# Patient Record
Sex: Female | Born: 1970 | Race: White | Hispanic: No | Marital: Single
Health system: Midwestern US, Academic
[De-identification: ages and names within clinical notes are randomized; demographics above are authoritative.]

---

## 2016-08-11 MED ORDER — TIZANIDINE 4 MG PO TAB
ORAL_TABLET | Freq: Every evening | 0 refills
Start: 2016-08-11 — End: ?

## 2016-08-25 MED ORDER — INDOMETHACIN 25 MG PO CAP
ORAL_CAPSULE | Freq: Every evening | 0 refills | PRN
Start: 2016-08-25 — End: ?

## 2016-08-28 MED ORDER — SUMATRIPTAN SUCCINATE 100 MG PO TAB
ORAL_TABLET | 0 refills
Start: 2016-08-28 — End: ?

## 2016-09-11 ENCOUNTER — Encounter: Admit: 2016-09-11 | Discharge: 2016-09-11

## 2016-09-11 DIAGNOSIS — G43709 Chronic migraine without aura, not intractable, without status migrainosus: Principal | ICD-10-CM

## 2016-09-11 NOTE — Telephone Encounter
Patient provided Botox migraine protocol education by clinic RN. Topics covered: benefits & risks, including most common side effects & realistic expectations; no indication in pregnancy & will not be administered if pregnant or trying to become pregnant; clinic prior authorization (PA) process; Botox Savings Program (if applicable). Patient provided with take home Botox folder by provider when in clinic recently, including the following patient handouts: Detailed Migraine diary; Botox Pre Treatment Information; Post Procedure Patient Instructions; [employer]/product designer(manufacturer) brochures: Geralynn Ochsont Lie Down (including information about Botox Savings Program), Stand Up, What To Expect With Botox (including Summary of Information about Botox insert); Direct contact information for clinic Botox Coordinator RN. All questions answered and clarification provided as needed. Patient verbalized understanding of all content discussed. Pt scheduled for BTX inj #1 on 09/18/16 @ 0930 with arrival to check in @ 0915.

## 2016-09-25 ENCOUNTER — Encounter: Admit: 2016-09-25 | Discharge: 2016-09-25

## 2016-09-25 ENCOUNTER — Ambulatory Visit: Admit: 2016-09-25 | Discharge: 2016-09-26 | Payer: MEDICAID

## 2016-09-25 DIAGNOSIS — F419 Anxiety disorder, unspecified: ICD-10-CM

## 2016-09-25 DIAGNOSIS — K5790 Diverticulosis of intestine, part unspecified, without perforation or abscess without bleeding: ICD-10-CM

## 2016-09-25 DIAGNOSIS — M26609 Unspecified temporomandibular joint disorder, unspecified side: ICD-10-CM

## 2016-09-25 DIAGNOSIS — R51 Headache: Principal | ICD-10-CM

## 2016-09-25 DIAGNOSIS — R002 Palpitations: ICD-10-CM

## 2016-09-25 DIAGNOSIS — E063 Autoimmune thyroiditis: ICD-10-CM

## 2016-09-25 MED ORDER — BOTULINUM TOXIN TYPE A 100 UNIT/ML INJECTION (OR)
200 [IU] | Freq: Once | 0 refills | Status: CP
Start: 2016-09-25 — End: ?
  Administered 2016-09-26: 22:00:00 200 [IU]

## 2016-09-25 NOTE — Progress Notes
Subjective: Andrea Nielsen is a 46 y.o. female here today for Botox for chronic migraines.     Do you understand the risks of botox as discussed in the consent and wish to proceed?   Patient states Yes    Are you currently pregnant or trying to become pregnant, as botox is not indicated while pregnant? Patient states No    Vitals:    09/25/16 1509   BP: (!) 148/91   Pulse: 62       Botox Injection Procedure  Previous Injection Date: NA    Information given verbally to the patient  today  included, but was not limited to:    Most common side effects: neck pain, headache, eyelid ptosis, migraine, muscular weakness, musculoskeletal stiffness, bronchitis, injection-site pain, musculoskeletal pain, myalgia, facial paresis, hypertension, muscle spasms; infection at injections sites, bruising, bleeding    Most serious side effects/risks: anaphylaxis, dysphagia,  arrhythmia, myocardial infarction, and in some cases, spontaneous death    Consent form was signed.    Confirmed: patient, procedure, side, site, safety procedures followed.     Performed by:  Sanda Klein, MD  Preparation: no contraindications noted to Botox, possible medications prior to procedure:  topical numbing cream prior to procedure, Preparation of site with alcohol    Procedure performed:   Indication: Chronic Migraine Headaches  Medication: Onabotulinum toxin A  2.63ml normal saline as diluent /100 units (Botulinum Toxin 5 units per 0.26mL, 200 units prepared, 155 units used, 45 units of waste)    Location: PREEMPT protocol Jerrell Belfast SK, and coauthors. Cephalalgia, 2010;30:793)  Muscles/Sites Injected  A - Bilateral Corrugator - 10 units divided in 2 sites  B - Midline Procerus - 5 units in 1 site  C - Bilateral Frontalis - 20 units divided in 4 sites  D - Bilateral Temporalis - 40 units divided in 8 sites  E - Bilateral Occipitalis - 30 units divided in 6 sites  F - Bilateral Cervical Paraspinals - 20 units divided in 4 sites G - Bilateral Trapezius - 30 units divided in 6 sites    Total Dose - 155 Units divided in 31 sites    Lot/Expiration: [id number]/ Dec 2020    Procedure tolerated: well.    Complications: none.  Diagnosis - Chronic Migraine Headaches   Course:  Progressing as expected.    Counseled: Patient/Family, Regarding diagnosis, Regarding treatment, Regarding medications. If any serious side effects occur, the patient has been instructed to go to the nearest emergency room and call our office.    Follow up: as scheduled prior to next Botox administration.

## 2016-09-26 DIAGNOSIS — G43709 Chronic migraine without aura, not intractable, without status migrainosus: Principal | ICD-10-CM

## 2016-10-01 ENCOUNTER — Encounter: Admit: 2016-10-01 | Discharge: 2016-10-01

## 2016-10-01 NOTE — Telephone Encounter
Patient lvm stating that she had botox on 6/21 and she is now experiencing muscle weakness in her forehead and eyebrows. Advised patient that the weakness and drooping will resolve on its own, patient voiced understanding.

## 2016-10-02 ENCOUNTER — Encounter: Admit: 2016-10-02 | Discharge: 2016-10-02

## 2016-10-02 MED ORDER — PROCHLORPERAZINE MALEATE 10 MG PO TAB
ORAL_TABLET | 0 refills
Start: 2016-10-02 — End: ?

## 2016-10-02 MED ORDER — PROCHLORPERAZINE MALEATE 10 MG PO TAB
ORAL_TABLET | 3 refills | Status: AC
Start: 2016-10-02 — End: 2019-02-16

## 2016-10-02 NOTE — Telephone Encounter
Patient is requesting refills on compazine previously prescribed by Dr. Patrcia DollyBittel. Are you continuing this?

## 2016-10-06 ENCOUNTER — Encounter: Admit: 2016-10-06 | Discharge: 2016-10-06

## 2016-10-06 MED ORDER — SUMATRIPTAN SUCCINATE 100 MG PO TAB
ORAL_TABLET | SUBCUTANEOUS | 1 refills | 30.00000 days | Status: AC
Start: 2016-10-06 — End: 2017-01-01

## 2016-10-06 MED ORDER — TIZANIDINE 4 MG PO TAB
8 mg | ORAL_TABLET | Freq: Every evening | ORAL | 1 refills | Status: AC | PRN
Start: 2016-10-06 — End: 2016-12-05

## 2016-10-06 MED ORDER — INDOMETHACIN 25 MG PO CAP
25 mg | ORAL_CAPSULE | Freq: Every evening | ORAL | 1 refills | 17.50000 days | Status: AC | PRN
Start: 2016-10-06 — End: 2019-08-16

## 2016-10-06 NOTE — Telephone Encounter
Patient is requesting refills on tizanidine 8mg  qHS, indomethacin 25mg  @ HS PRN, and imitrex 100mg .     Dr. Chales AbrahamsGupta are you continuing these medications?

## 2016-11-20 ENCOUNTER — Encounter: Admit: 2016-11-20 | Discharge: 2016-11-20

## 2016-11-20 MED ORDER — TOPIRAMATE 50 MG PO TAB
ORAL_TABLET | Freq: Every day | 0 refills | Status: AC
Start: 2016-11-20 — End: 2017-01-27

## 2016-12-05 ENCOUNTER — Encounter: Admit: 2016-12-05 | Discharge: 2016-12-05

## 2016-12-05 MED ORDER — TIZANIDINE 4 MG PO TAB
8 mg | ORAL_TABLET | Freq: Every evening | ORAL | 0 refills | Status: AC | PRN
Start: 2016-12-05 — End: 2017-01-01

## 2016-12-19 ENCOUNTER — Ambulatory Visit: Admit: 2016-12-19 | Discharge: 2016-12-20 | Payer: MEDICAID

## 2016-12-19 ENCOUNTER — Encounter: Admit: 2016-12-19 | Discharge: 2016-12-19

## 2016-12-19 DIAGNOSIS — M26609 Unspecified temporomandibular joint disorder, unspecified side: ICD-10-CM

## 2016-12-19 DIAGNOSIS — F419 Anxiety disorder, unspecified: ICD-10-CM

## 2016-12-19 DIAGNOSIS — R002 Palpitations: ICD-10-CM

## 2016-12-19 DIAGNOSIS — K5790 Diverticulosis of intestine, part unspecified, without perforation or abscess without bleeding: ICD-10-CM

## 2016-12-19 DIAGNOSIS — R51 Headache: Principal | ICD-10-CM

## 2016-12-19 DIAGNOSIS — E063 Autoimmune thyroiditis: ICD-10-CM

## 2016-12-19 MED ORDER — BOTULINUM TOXIN TYPE A 100 UNIT/ML INJECTION (OR)
200 [IU] | Freq: Once | 0 refills | Status: CP
Start: 2016-12-19 — End: ?
  Administered 2016-12-19: 21:00:00 200 [IU]

## 2016-12-19 NOTE — Progress Notes
Subjective: Andrea Nielsen is a 46 y.o. female here today for Botox for chronic migraines. She does not want injections around eyebrow muscles and middle of the head.     Do you understand the risks of botox as discussed in the consent and wish to proceed?   Patient states Yes    Are you currently pregnant or trying to become pregnant, as botox is not indicated while pregnant? Patient states No    Vitals:    12/19/16 1551   BP: (!) 159/95   Pulse: 87   SpO2: 98%       Botox Injection Procedure  Previous Injection Date: 25 September 2016    Information given verbally to the patient  today  included, but was not limited to:    Most common side effects: neck pain, headache, eyelid ptosis, migraine, muscular weakness, musculoskeletal stiffness, bronchitis, injection-site pain, musculoskeletal pain, myalgia, facial paresis, hypertension, muscle spasms; infection at injections sites, bruising, bleeding    Most serious side effects/risks: anaphylaxis, dysphagia,  arrhythmia, myocardial infarction, and in some cases, spontaneous death    Consent form was signed.    Confirmed: patient, procedure, side, site, safety procedures followed.     Performed by: Sanda Klein, MD  Preparation: no contraindications noted to Botox, possible medications prior to procedure:  topical numbing cream prior to procedure, Preparation of site with alcohol    Procedure performed:   Indication: Chronic Migraine Headaches  Medication: Onabotulinum toxin A  2.86ml normal saline as diluent /100 units (Botulinum Toxin 5 units per 0.58mL, 200 units prepared, 155 units used, 45 units of waste)    Location: PREEMPT protocol Jerrell Belfast SK, and coauthors. Cephalalgia, 2010;30:793)  Muscles/Sites Injected  C - Bilateral Frontalis - 20 units divided in 4 sites  D - Bilateral Temporalis - 40 units divided in 8 sites  E - Bilateral Occipitalis - 30 units divided in 6 sites  F - Bilateral Cervical Paraspinals - 20 units divided in 4 sites G - Bilateral Trapezius - 30 units divided in 6 sites    Total Dose - 140 Units divided in 28 sites    Lot/Expiration: [id number], 06/2019    Procedure tolerated: well.    Complications: none.  Diagnosis - Chronic Migraine Headaches   Course:  Progressing as expected.    Counseled: Patient/Family, Regarding diagnosis, Regarding treatment, Regarding medications. If any serious side effects occur, the patient has been instructed to go to the nearest emergency room and call our office.    Follow up: as scheduled prior to next Botox administration.

## 2016-12-20 DIAGNOSIS — G43709 Chronic migraine without aura, not intractable, without status migrainosus: Principal | ICD-10-CM

## 2017-01-01 ENCOUNTER — Encounter: Admit: 2017-01-01 | Discharge: 2017-01-01

## 2017-01-01 MED ORDER — SUMATRIPTAN SUCCINATE 100 MG PO TAB
ORAL_TABLET | SUBCUTANEOUS | 2 refills | 30.00000 days | Status: AC
Start: 2017-01-01 — End: 2019-02-16

## 2017-01-01 MED ORDER — TIZANIDINE 4 MG PO TAB
8 mg | ORAL_TABLET | Freq: Every evening | ORAL | 2 refills | Status: AC | PRN
Start: 2017-01-01 — End: 2019-02-16

## 2017-01-27 ENCOUNTER — Encounter: Admit: 2017-01-27 | Discharge: 2017-01-27

## 2017-01-27 MED ORDER — TOPIRAMATE 50 MG PO TAB
100 mg | ORAL_TABLET | Freq: Every evening | ORAL | 0 refills | Status: AC
Start: 2017-01-27 — End: 2019-02-16

## 2017-03-12 ENCOUNTER — Encounter: Admit: 2017-03-12 | Discharge: 2017-03-12

## 2017-03-12 NOTE — Telephone Encounter
Email received from Tish in [employer] "Received called from [place] 03/11/17 at [employer]  [phone] needing additional information.       Called patient for update. She reports that prior to initiating Botox therapy injections, she was experiencing 30/30 migraine days per month, lasting all day. Currently she describes "tension headaches, primarily on my right side and the back of my neck. In my temple usually and shoots across. It's sharp. I have them every day again. It starts probably after lunch and for the remainder of the day. Every day."     Update faxed to [employer] as requested. Notified Tish in [employer] Dept of same.

## 2017-03-20 NOTE — Telephone Encounter
Received fax from [employer] stating "We have reviewed your request for BOTOX 200UNIT SOLUTION RECONSTITUTED for the member identified above and have approved the request as follows"     Approved for March 19, 2017 through Sep 02, 2017  Approved J Code: [id number]

## 2017-06-01 ENCOUNTER — Encounter: Admit: 2017-06-01 | Discharge: 2017-06-01

## 2019-01-27 ENCOUNTER — Encounter: Admit: 2019-01-27 | Discharge: 2019-01-27

## 2019-01-27 DIAGNOSIS — R69 Illness, unspecified: Secondary | ICD-10-CM

## 2019-02-16 ENCOUNTER — Ambulatory Visit: Admit: 2019-02-16 | Discharge: 2019-02-16 | Payer: BC Managed Care – PPO

## 2019-02-16 ENCOUNTER — Encounter: Admit: 2019-02-16 | Discharge: 2019-02-16 | Payer: BC Managed Care – PPO

## 2019-02-16 DIAGNOSIS — F419 Anxiety disorder, unspecified: Secondary | ICD-10-CM

## 2019-02-16 DIAGNOSIS — M26609 Unspecified temporomandibular joint disorder, unspecified side: Secondary | ICD-10-CM

## 2019-02-16 DIAGNOSIS — R519 Generalized headaches: Secondary | ICD-10-CM

## 2019-02-16 DIAGNOSIS — K5790 Diverticulosis of intestine, part unspecified, without perforation or abscess without bleeding: Secondary | ICD-10-CM

## 2019-02-16 DIAGNOSIS — G43809 Other migraine, not intractable, without status migrainosus: Secondary | ICD-10-CM

## 2019-02-16 DIAGNOSIS — R002 Palpitations: Secondary | ICD-10-CM

## 2019-02-16 DIAGNOSIS — E063 Autoimmune thyroiditis: Secondary | ICD-10-CM

## 2019-02-16 MED ORDER — SUMATRIPTAN SUCCINATE 100 MG PO TAB
ORAL_TABLET | SUBCUTANEOUS | 2 refills | 30.00000 days | Status: DC
Start: 2019-02-16 — End: 2019-05-30

## 2019-02-16 MED ORDER — BACLOFEN 5 MG PO TAB
5 mg | ORAL_TABLET | Freq: Two times a day (BID) | ORAL | 3 refills | Status: DC
Start: 2019-02-16 — End: 2019-06-13

## 2019-02-16 NOTE — Progress Notes
Date of Service: 02/16/2019    Subjective:             Andrea Nielsen is a 48 y.o. female seen in the clinic for migraine headache.     History of Present Illness  48 year old female who had a seizure last month and was admitted to the hospital for 4 days. CT head was normal. She lives with her boyfriend in [place], [place] and her daughter lives with her as well. 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 Her pain is located in the back of her neck and temples. It is about 6/10 in intensity and has a headache every day. There is nausea and vomiting with the pain along with photophobia and phonophobia. The last session of Botox in 2018 and she has not been taking the medicines since 2018. Her last visit to the ER was 2 weeks ago. She has not missed work due to headaches and taking Tylenol/Ibuprofen for headaches.     In the past she has been tried on indomethacin, acetaminophen, ibuprofen, compazine, topiramate, tizanidine, venlafaxine, and Imitrex.        Review of Systems   Constitutional: Positive for activity change, diaphoresis, fatigue and unexpected weight change.   HENT: Positive for ear pain.    Eyes: Positive for photophobia and visual disturbance.   Gastrointestinal: Positive for abdominal distention, blood in stool and diarrhea.   Musculoskeletal: Positive for arthralgias, back pain, myalgias, neck pain and neck stiffness.   Neurological: Positive for seizures and headaches.   Psychiatric/Behavioral: Positive for decreased concentration, dysphoric mood and sleep disturbance. The patient is nervous/anxious.    All other systems reviewed and are negative.    Objective:         ? acetaminophen (TYLENOL) 325 mg tablet Take 325 mg by mouth every 4 hours as needed for Pain.   ? ALPRAZolam (XANAX) 0.5 mg tablet Take 0.5 mg by mouth at bedtime as needed.   ? amLODIPine (NORVASC) 5 mg tablet Take 5 mg by mouth daily.   ? diphenhydrAMINE (BENADRYL ALLERGY) 25 mg tablet Take 25 mg by mouth at bedtime as needed.   ? HYDROmorphone (DILAUDID) 2 mg tablet Take 2 mg by mouth every 4 hours as needed for Pain   ? ibuprofen (ADVIL) 200 mg tablet Take 400-600 mg by mouth every 6 hours as needed for Pain. Take with food.   ? indomethacin (INDOCIN) 25 mg capsule Take 1 capsule by mouth at bedtime as needed. Take with food.   ? levETIRAcetam (KEPPRA) 500 mg tablet Take 500 mg by mouth twice daily.   ? levothyroxine (SYNTHROID) 75 mcg tablet Take 75 mcg by mouth daily.   ? lisinopril (PRINIVIL; ZESTRIL) 10 mg tablet Take 10 mg by mouth daily.   ? losartan (COZAAR) 100 mg tablet Take 100 mg by mouth daily.   ? pantoprazole DR (PROTONIX) 40 mg tablet Take 40 mg by mouth daily.   ? prochlorperazine maleate (COMPAZINE) 10 mg tablet TAKE 1/2 TO 1 TABLET BY MOUTH WITH BENADRYL 25MG  AND ALEVE EVERY 6 HOURS AS NEEDED FOR ACUTE HEADACHE   ? sumatriptan succinate (IMITREX) 100 mg tablet Take 1/2 tab by mouth at onset of headache. May repeat in 2 hours as needed. Max 2 per day, 8 per month   ? tiZANidine (ZANAFLEX) 4 mg tablet Take two tablets by mouth at bedtime as needed.   ? topiramate (TOPAMAX) 50 mg tablet Take two tablets by mouth at bedtime daily.   ?  valACYclovir (VALTREX) 500 mg tablet as Needed.   ? venlafaxine XR (EFFEXOR XR) 75 mg capsule Take 75 mg by mouth daily.     Vitals:    02/16/19 1600   BP: 138/83   BP Source: Arm, Left Upper   Patient Position: Sitting   Pulse: 97   Temp: 36.7 ?C (98.1 ?F)   TempSrc: Oral   SpO2: 97%   Weight: 78.6 kg (173 lb 3.2 oz)   PainSc: Six     Body mass index is 30.68 kg/m?Marland Kitchen     Physical Exam  Did not examine her today        Assessment and Plan:  48 year old female with migraine headaches.     1. Start baclofen 5 mg twice a day.     2. Sumatriptan to break the headache.       I spent 15 minutes with this patient. More than 50% of the time was spent in counseling and coordination of care.

## 2019-02-27 IMAGING — MG MAMMO SCREEN BILAT W OR WO CAD
4 series · 4 of 4 positions shown · non-contrast
Comparison: 09/05/2014.

Bilateral screening mammogram. CAD analysis utilized .
HISTORY: Screening.

[R CC]
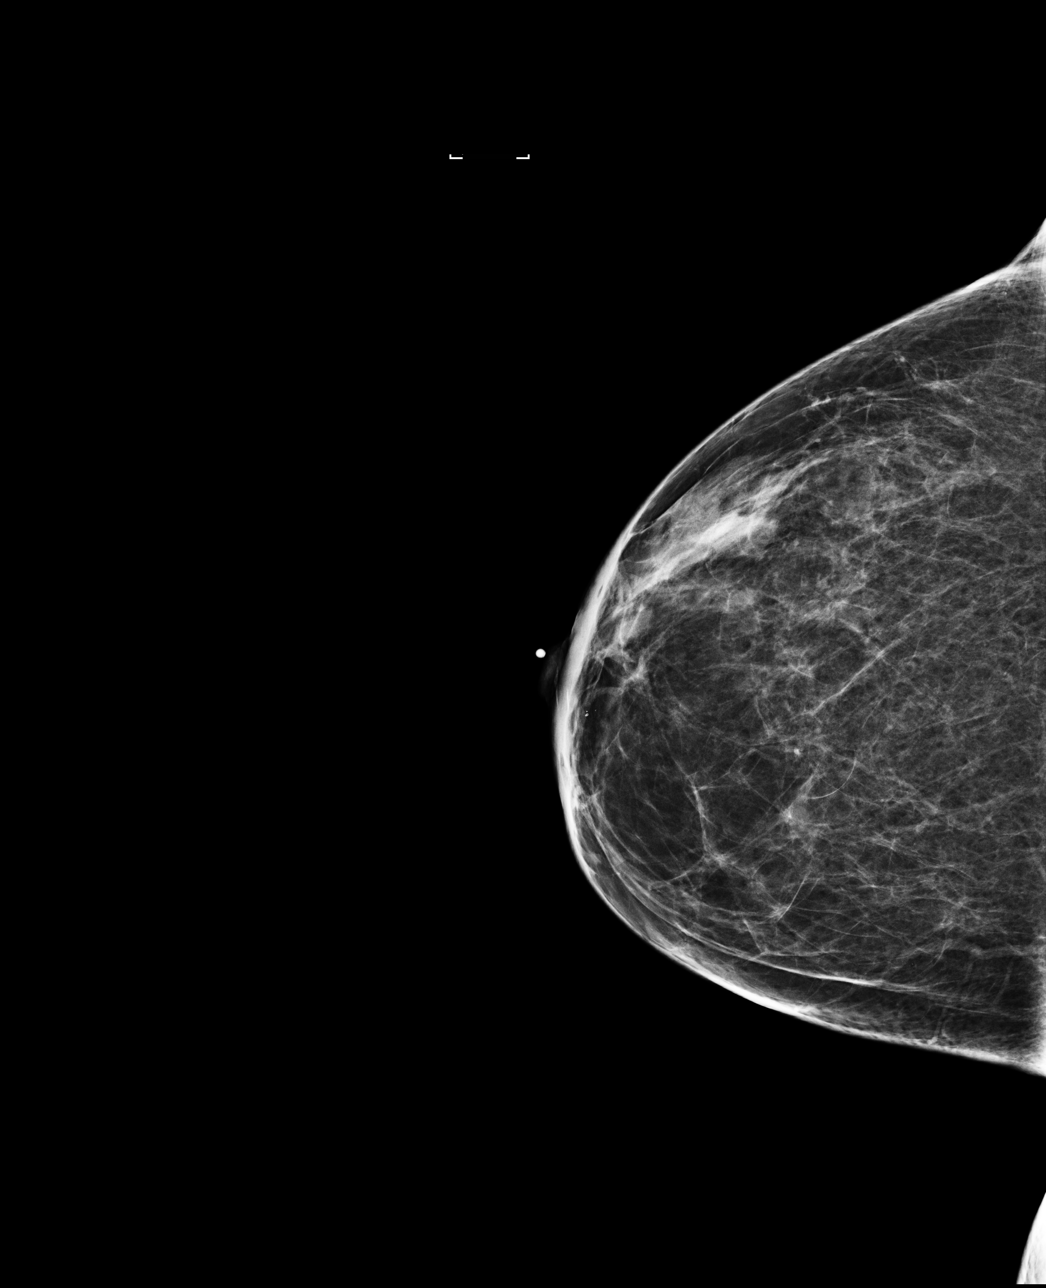

[R MLO]
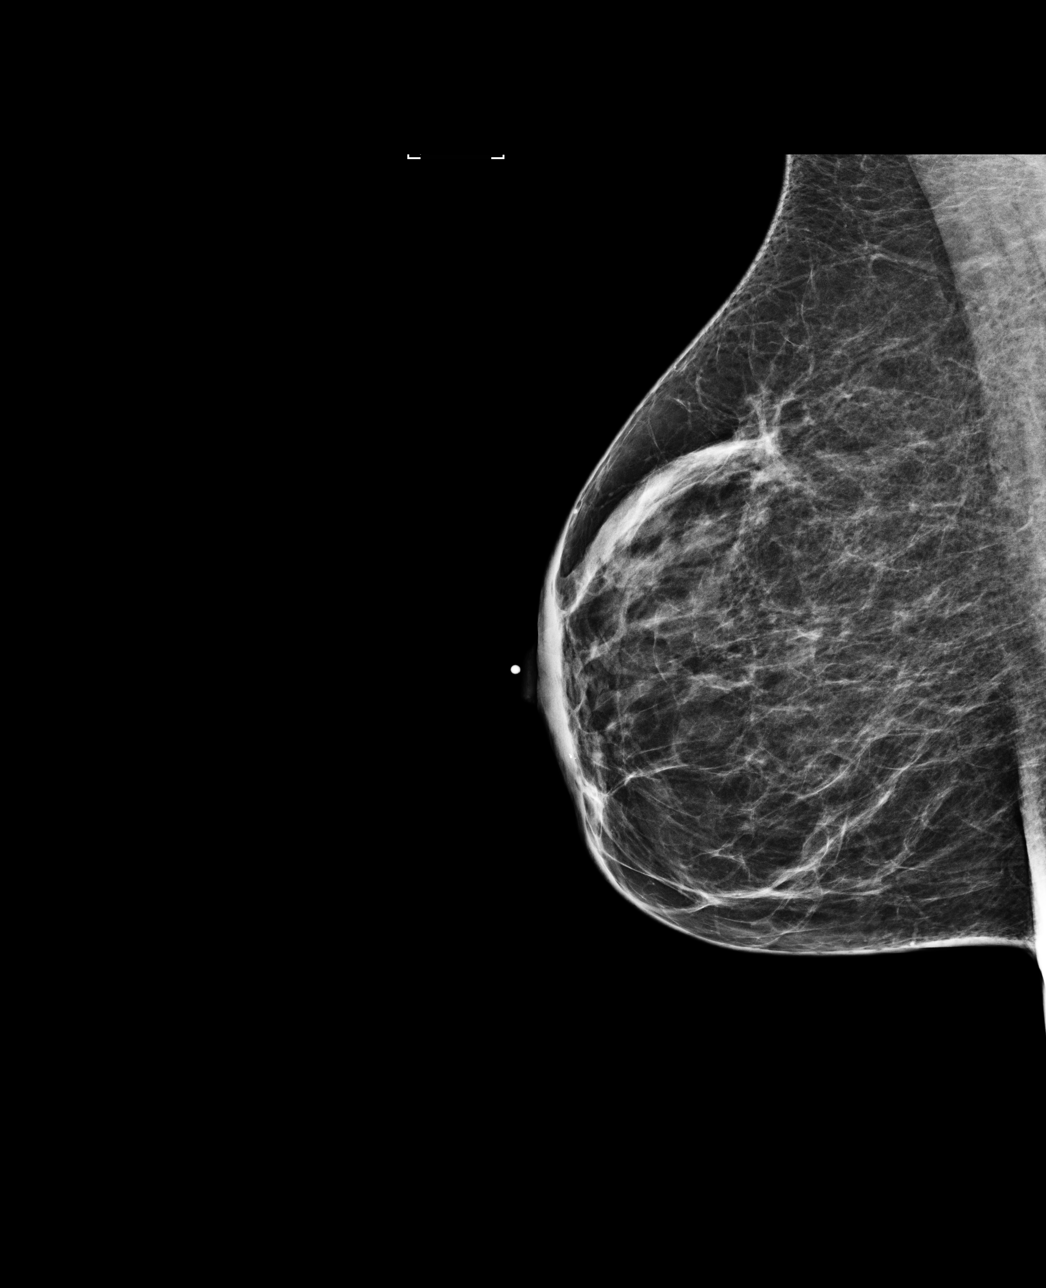

[L MLO]
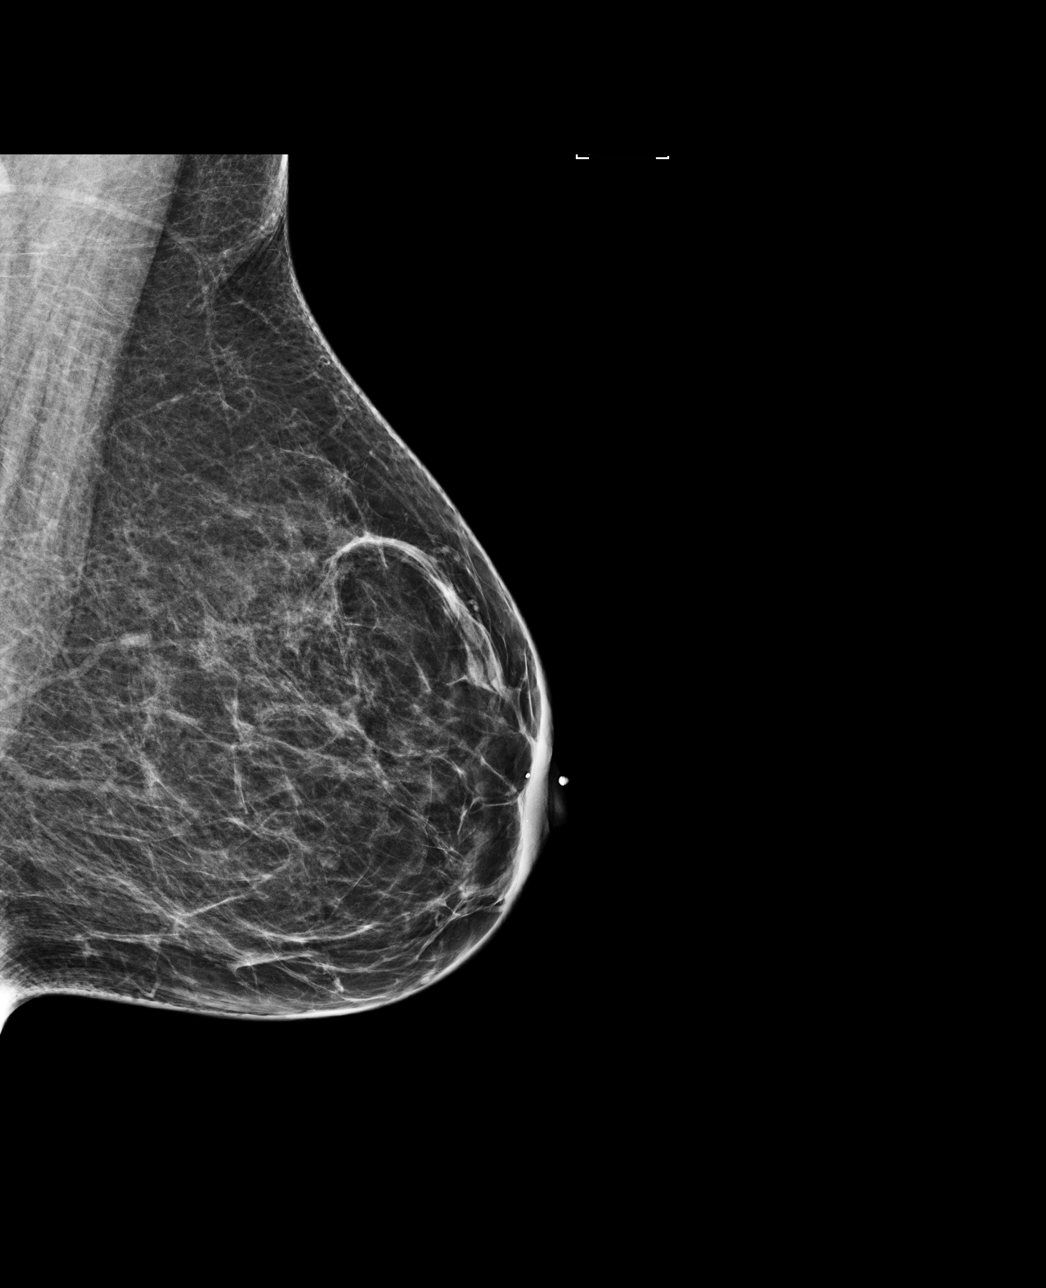

[L CC]
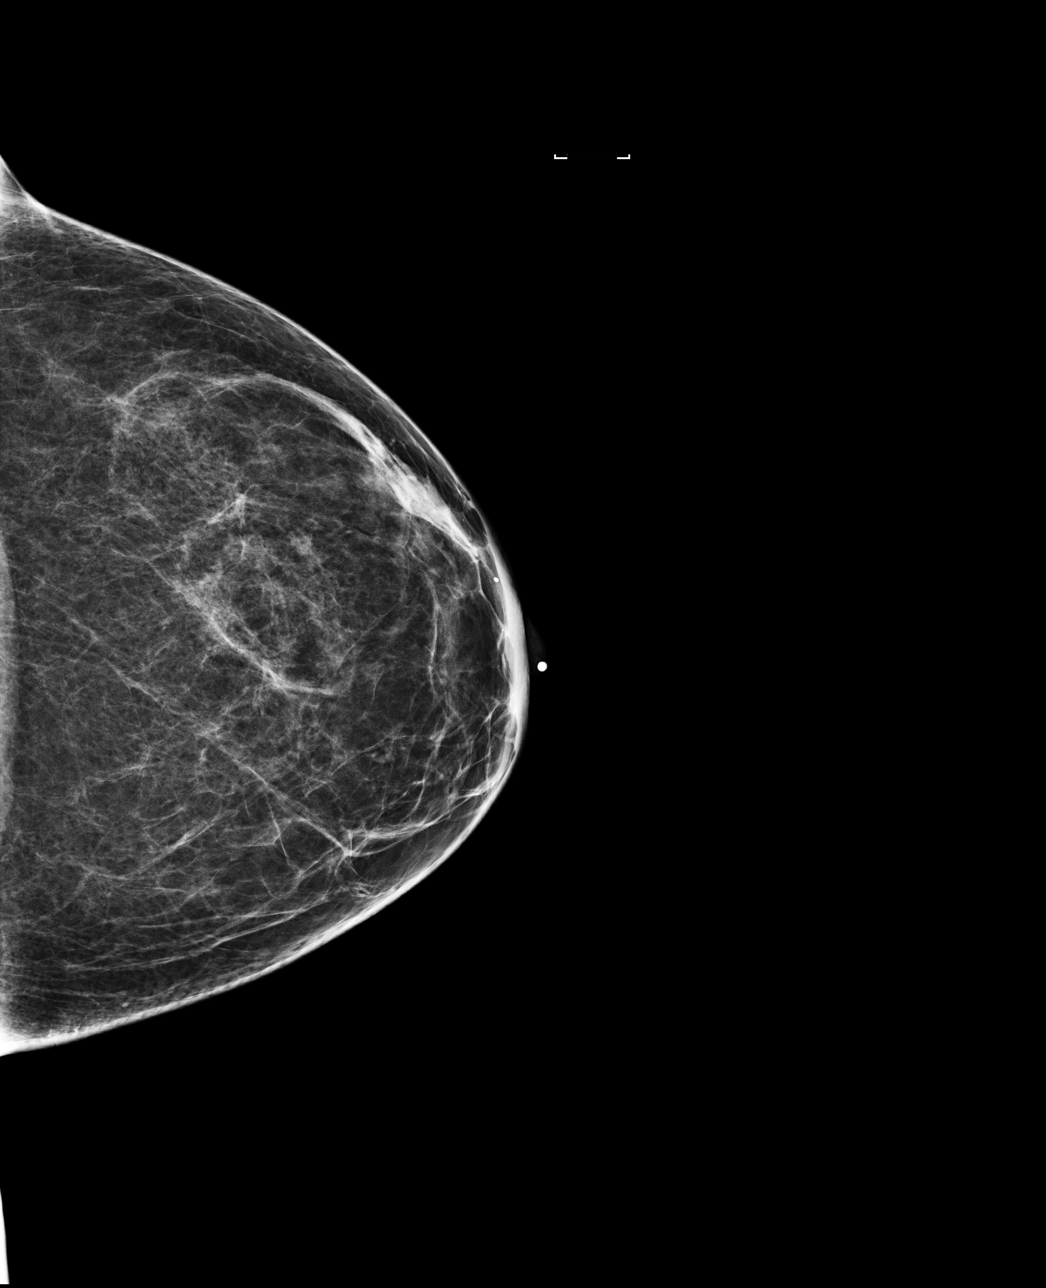

[4 of 4 positions shown; findings below may reference images not displayed]

FINDINGS: The breast tissue is composed of scattered fibroglandular 
densities. No suspicious masses, clustered microcalcifications, or
suspicious architectural distortion. Mildly asymmetrically increased
fibroglandular density in the right upper outer quadrant without
evidence of mass or architectural distortion unchanged. Stable 5 mm 
circumscribed nodular density located in the lateral aspect of the
middle third of the left breast, possibly a small lymph node. 
IMPRESSION
IMPRESSION: No mammographic evidence of malignancy. In the absence of 
clinical findings, a screening mammogram should be obtained in one
year. 
BI-RADS 2: Benign findings. 
Breast Density Category: B

## 2019-05-28 ENCOUNTER — Encounter: Admit: 2019-05-28 | Discharge: 2019-05-28 | Payer: BC Managed Care – PPO

## 2019-05-28 DIAGNOSIS — G43809 Other migraine, not intractable, without status migrainosus: Secondary | ICD-10-CM

## 2019-05-30 MED ORDER — SUMATRIPTAN SUCCINATE 100 MG PO TAB
ORAL_TABLET | SUBCUTANEOUS | 2 refills | 30.00000 days | Status: DC
Start: 2019-05-30 — End: 2019-08-22

## 2019-06-13 ENCOUNTER — Encounter: Admit: 2019-06-13 | Discharge: 2019-06-13 | Payer: BC Managed Care – PPO

## 2019-06-13 DIAGNOSIS — G43809 Other migraine, not intractable, without status migrainosus: Secondary | ICD-10-CM

## 2019-06-13 MED ORDER — BACLOFEN 5 MG PO TAB
ORAL_TABLET | Freq: Two times a day (BID) | 3 refills | Status: DC
Start: 2019-06-13 — End: 2019-08-16

## 2019-08-16 ENCOUNTER — Ambulatory Visit: Admit: 2019-08-16 | Discharge: 2019-08-16 | Payer: BC Managed Care – PPO

## 2019-08-16 ENCOUNTER — Encounter: Admit: 2019-08-16 | Discharge: 2019-08-16 | Payer: BC Managed Care – PPO

## 2019-08-16 DIAGNOSIS — G43809 Other migraine, not intractable, without status migrainosus: Secondary | ICD-10-CM

## 2019-08-16 DIAGNOSIS — K5790 Diverticulosis of intestine, part unspecified, without perforation or abscess without bleeding: Secondary | ICD-10-CM

## 2019-08-16 DIAGNOSIS — R002 Palpitations: Secondary | ICD-10-CM

## 2019-08-16 DIAGNOSIS — R519 Generalized headaches: Secondary | ICD-10-CM

## 2019-08-16 DIAGNOSIS — M26609 Unspecified temporomandibular joint disorder, unspecified side: Secondary | ICD-10-CM

## 2019-08-16 DIAGNOSIS — F419 Anxiety disorder, unspecified: Secondary | ICD-10-CM

## 2019-08-16 DIAGNOSIS — E063 Autoimmune thyroiditis: Secondary | ICD-10-CM

## 2019-08-16 MED ORDER — MEMANTINE 5 MG PO TAB
5 mg | ORAL_TABLET | Freq: Two times a day (BID) | ORAL | 3 refills | Status: AC
Start: 2019-08-16 — End: ?

## 2019-08-16 NOTE — Progress Notes
49 year old female who lives by herself in [place], [place] and her daughter lives with her. 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     Her pain is located in the back of her neck and temples. It is about 6/10 in intensity and has a headache every day. There is nausea and vomiting with the pain along with photophobia and phonophobia. The last session of Botox in 2018 and she has not been taking the medicines since 2018. Her last visit to the ER was Oct 2020. She has not missed work due to headaches and taking Tylenol/Ibuprofen for headaches.     She has a different type of headache as well which is associated with vomiting and can even lead to a seizure.   ?  In the past she has been tried on indomethacin, acetaminophen, ibuprofen, compazine, topiramate, tizanidine, venlafaxine, and Imitrex.   ?  Vitals:    08/16/19 1611   BP: 133/79   Pulse: 100   SpO2: 96%     No Known Allergies    Current Outpatient Medications   Medication Sig Dispense Refill   ? acetaminophen (TYLENOL) 325 mg tablet Take 325 mg by mouth every 4 hours as needed for Pain.     ? ALPRAZolam (XANAX) 0.5 mg tablet Take 0.5 mg by mouth at bedtime as needed.     ? amLODIPine (NORVASC) 5 mg tablet Take 5 mg by mouth daily.     ? diphenhydrAMINE (BENADRYL ALLERGY) 25 mg tablet Take 25 mg by mouth at bedtime as needed.     ? ibuprofen (ADVIL) 200 mg tablet Take 400-600 mg by mouth every 6 hours as needed for Pain. Take with food.     ? levETIRAcetam (KEPPRA) 500 mg tablet Take 500 mg by mouth twice daily.     ? levothyroxine (SYNTHROID) 75 mcg tablet Take 75 mcg by mouth daily.     ? losartan (COZAAR) 100 mg tablet Take 100 mg by mouth daily.     ? pantoprazole DR (PROTONIX) 40 mg tablet Take 40 mg by mouth daily.     ? sumatriptan succinate (IMITREX) 100 mg tablet TAKE 1/2 TABLET BY MOUTH AT ONSET OF HEADACHE. MAY REPEAT IN 2 HOURS AS NEEDED. MAX 2 PER DAY, 8 PER MONTH 8 tablet 2   ? valACYclovir (VALTREX) 500 mg tablet as Needed.     ? venlafaxine XR (EFFEXOR XR) 75 mg capsule Take 75 mg by mouth daily.       No current facility-administered medications for this visit.      Assessment and Plan:  49 year old female with migraine and tension type headaches.   ?  -- Stop baclofen.     -- Start memantine 5 mg BID.   ?  -- Pain clinic referral.     -- Consider KFT at the time of next visit.       Time spent 20 minutes  This includes the time involved in typing this note   ?

## 2019-08-21 ENCOUNTER — Encounter: Admit: 2019-08-21 | Discharge: 2019-08-21 | Payer: BC Managed Care – PPO

## 2019-08-21 DIAGNOSIS — G43809 Other migraine, not intractable, without status migrainosus: Secondary | ICD-10-CM

## 2019-08-22 MED ORDER — SUMATRIPTAN SUCCINATE 100 MG PO TAB
ORAL_TABLET | SUBCUTANEOUS | 2 refills | 30.00000 days | Status: AC
Start: 2019-08-22 — End: ?

## 2019-11-19 ENCOUNTER — Encounter: Admit: 2019-11-19 | Discharge: 2019-11-19 | Payer: BC Managed Care – PPO

## 2019-11-19 DIAGNOSIS — G43809 Other migraine, not intractable, without status migrainosus: Secondary | ICD-10-CM

## 2019-11-19 MED ORDER — SUMATRIPTAN SUCCINATE 100 MG PO TAB
ORAL_TABLET | 2 refills
Start: 2019-11-19 — End: ?

## 2024-03-11 ENCOUNTER — Encounter: Admit: 2024-03-11 | Discharge: 2024-03-11 | Payer: BLUE CROSS/BLUE SHIELD

## 2024-03-13 ENCOUNTER — Encounter: Admit: 2024-03-13 | Discharge: 2024-03-13 | Payer: BLUE CROSS/BLUE SHIELD

## 2024-04-01 ENCOUNTER — Encounter: Admit: 2024-04-01 | Discharge: 2024-04-01 | Payer: BLUE CROSS/BLUE SHIELD

## 2024-04-11 ENCOUNTER — Encounter: Admit: 2024-04-11 | Discharge: 2024-04-11 | Payer: BLUE CROSS/BLUE SHIELD

## 2024-04-13 ENCOUNTER — Encounter: Admit: 2024-04-13 | Discharge: 2024-04-13 | Payer: BLUE CROSS/BLUE SHIELD

## 2024-04-13 NOTE — Telephone Encounter [36]
 No call made per protocol, pt does not have care with an established provider at [facility].   Pt was seen in clinic for HFU only.

## 2024-04-21 ENCOUNTER — Encounter: Admit: 2024-04-21 | Discharge: 2024-04-21 | Payer: BLUE CROSS/BLUE SHIELD

## 2024-04-29 ENCOUNTER — Encounter: Admit: 2024-04-29 | Discharge: 2024-04-29 | Payer: BLUE CROSS/BLUE SHIELD

## 2024-05-04 ENCOUNTER — Encounter: Admit: 2024-05-04 | Discharge: 2024-05-04 | Payer: BLUE CROSS/BLUE SHIELD
